# Patient Record
Sex: Female | Born: 1945 | Race: White | Hispanic: No | Marital: Single | Smoking: Current some day smoker
Health system: Southern US, Community
[De-identification: ages and names within clinical notes are randomized; demographics above are authoritative.]

## PROBLEM LIST (undated history)

## (undated) DIAGNOSIS — R0602 Shortness of breath: Secondary | ICD-10-CM

## (undated) DIAGNOSIS — E785 Hyperlipidemia, unspecified: Secondary | ICD-10-CM

## (undated) DIAGNOSIS — Z780 Asymptomatic menopausal state: Secondary | ICD-10-CM

## (undated) DIAGNOSIS — F419 Anxiety disorder, unspecified: Secondary | ICD-10-CM

## (undated) DIAGNOSIS — R110 Nausea: Secondary | ICD-10-CM

## (undated) DIAGNOSIS — I10 Essential (primary) hypertension: Secondary | ICD-10-CM

## (undated) DIAGNOSIS — M549 Dorsalgia, unspecified: Secondary | ICD-10-CM

## (undated) DIAGNOSIS — R079 Chest pain, unspecified: Secondary | ICD-10-CM

## (undated) HISTORY — DX: Shortness of breath: R06.02

## (undated) HISTORY — DX: Hyperlipidemia, unspecified: E78.5

## (undated) HISTORY — PX: CHOLECYSTECTOMY: SHX55

## (undated) HISTORY — DX: Anxiety disorder, unspecified: F41.9

## (undated) HISTORY — DX: Nausea: R11.0

## (undated) HISTORY — DX: Dorsalgia, unspecified: M54.9

## (undated) HISTORY — DX: Asymptomatic menopausal state: Z78.0

## (undated) HISTORY — DX: Chest pain, unspecified: R07.9

## (undated) HISTORY — PX: ANKLE FRACTURE SURGERY: SHX122

---

## 1997-11-23 ENCOUNTER — Other Ambulatory Visit: Admission: RE | Admit: 1997-11-23 | Discharge: 1997-11-23 | Payer: Self-pay | Admitting: Obstetrics and Gynecology

## 1999-10-01 ENCOUNTER — Encounter: Admission: RE | Admit: 1999-10-01 | Discharge: 1999-10-01 | Payer: Self-pay | Admitting: Family Medicine

## 1999-10-01 ENCOUNTER — Encounter: Payer: Self-pay | Admitting: Family Medicine

## 2001-12-23 ENCOUNTER — Ambulatory Visit (HOSPITAL_COMMUNITY): Admission: RE | Admit: 2001-12-23 | Discharge: 2001-12-23 | Payer: Self-pay | Admitting: General Surgery

## 2001-12-23 ENCOUNTER — Encounter (INDEPENDENT_AMBULATORY_CARE_PROVIDER_SITE_OTHER): Payer: Self-pay | Admitting: Specialist

## 2005-01-16 ENCOUNTER — Encounter (INDEPENDENT_AMBULATORY_CARE_PROVIDER_SITE_OTHER): Payer: Self-pay | Admitting: *Deleted

## 2005-01-16 ENCOUNTER — Ambulatory Visit (HOSPITAL_COMMUNITY): Admission: RE | Admit: 2005-01-16 | Discharge: 2005-01-16 | Payer: Self-pay | Admitting: Gastroenterology

## 2010-07-07 ENCOUNTER — Emergency Department (HOSPITAL_BASED_OUTPATIENT_CLINIC_OR_DEPARTMENT_OTHER)
Admission: EM | Admit: 2010-07-07 | Discharge: 2010-07-07 | Payer: Self-pay | Source: Home / Self Care | Admitting: Emergency Medicine

## 2010-07-07 ENCOUNTER — Ambulatory Visit (HOSPITAL_COMMUNITY)
Admission: EM | Admit: 2010-07-07 | Discharge: 2010-07-08 | Payer: Self-pay | Source: Home / Self Care | Attending: Orthopedic Surgery | Admitting: Orthopedic Surgery

## 2010-10-06 LAB — DIFFERENTIAL
Basophils Relative: 0 % (ref 0–1)
Eosinophils Absolute: 0.1 10*3/uL (ref 0.0–0.7)
Lymphs Abs: 1.7 10*3/uL (ref 0.7–4.0)
Monocytes Absolute: 0.5 10*3/uL (ref 0.1–1.0)
Monocytes Relative: 5 % (ref 3–12)
Neutrophils Relative %: 75 % (ref 43–77)

## 2010-10-06 LAB — URINALYSIS, ROUTINE W REFLEX MICROSCOPIC
Glucose, UA: NEGATIVE mg/dL
Hgb urine dipstick: NEGATIVE
Ketones, ur: 15 mg/dL — AB
Protein, ur: NEGATIVE mg/dL
pH: 6 (ref 5.0–8.0)

## 2010-10-06 LAB — COMPREHENSIVE METABOLIC PANEL
AST: 21 U/L (ref 0–37)
Albumin: 3.7 g/dL (ref 3.5–5.2)
Calcium: 9 mg/dL (ref 8.4–10.5)
Chloride: 110 mEq/L (ref 96–112)
Creatinine, Ser: 0.62 mg/dL (ref 0.4–1.2)
Glucose, Bld: 98 mg/dL (ref 70–99)
Total Bilirubin: 0.5 mg/dL (ref 0.3–1.2)
Total Protein: 6.7 g/dL (ref 6.0–8.3)

## 2010-10-06 LAB — CBC
Hemoglobin: 13.6 g/dL (ref 12.0–15.0)
Platelets: 204 10*3/uL (ref 150–400)
RBC: 4.35 MIL/uL (ref 3.87–5.11)
WBC: 9.7 10*3/uL (ref 4.0–10.5)

## 2010-10-06 LAB — URINE MICROSCOPIC-ADD ON

## 2010-12-12 NOTE — Op Note (Signed)
NAMEMYKELL, GENAO              ACCOUNT NO.:  [id number]   MEDICAL RECORD NO.:  [id number]          PATIENT TYPE:  AMB   LOCATION:  ENDO                         FACILITY:  [facility]   PHYSICIAN:  Anselmo Rod, M.D.  DATE OF BIRTH:  1946/06/03   DATE OF PROCEDURE:  01/16/2005  DATE OF DISCHARGE:                                 OPERATIVE REPORT   PROCEDURE:  Colonoscopy with cold biopsies x 2.   ENDOSCOPIST:  Charna Elizabeth, M.D.   INSTRUMENT USED:  Olympus video colonoscope.   INDICATIONS FOR PROCEDURE:  65 year old white female undergoing screening  colonoscopy.  The patient has a history of occasional diarrhea, but denies  any rectal bleeding, change in bowel habits, or family history of colon  cancer.   PREPROCEDURE PREPARATION:  Informed consent was obtained from the patient.  The patient was fasted for eight hours prior to the procedure and prepped  with a bottle of magnesium citrate and a gallon of GoLYTELY the night prior  to the procedure.  The risks and benefits of the procedure including a 10%  missed rate of cancer and polyps was discussed with the patient, as well.   PREPROCEDURE PHYSICAL:  Patient with stable vital signs.  Neck supple.  Chest clear to auscultation.  S1 and S2 regular.  Abdomen soft with normal  bowel sounds.   DESCRIPTION OF PROCEDURE:  The patient was placed in the left lateral  decubitus position, sedated with 75 mg of Demerol and 7.5 mg Versed in slow  incremental doses.  Once the patient was adequately sedated, maintained on  low flow oxygen and continuous cardiac monitoring, the Olympus video  colonoscope was advanced from the rectum to the cecum.  The appendiceal  orifice and ileocecal valve were clearly visualized and photographed.  A  small sessile polyp was biopsied x 2 (cold biopsy) from the rectosigmoid  colon.  Small internal hemorrhoids were seen on retroflexion.  Early sigmoid  diverticulosis noted in the left colon.   IMPRESSION:  1.  Small nonbleeding internal hemorrhoids.  2.  Early sigmoid diverticulosis.  3.  Small sessile polyp biopsied from rectosigmoid colon (cold biopsies x      2).   RECOMMENDATIONS:  1.  Await pathology results.  2.  Avoid all nonsteroidals including aspirin for the next two weeks.  3.  Brochures on diverticulosis were given to the for education.  4.  Repeat colonoscopy depending on pathology results.  5.  Outpatient follow up on a p.r.n. basis in the future.       JNM/MEDQ  D:  01/16/2005  T:  01/16/2005  Job:  [id number]   cc:   Teena Irani. Arlyce Dice, M.D.  [address]  [place]  [place] [address]  Fax: [phone]

## 2010-12-12 NOTE — Op Note (Signed)
[facility]  Patient:    Susan Irwin, Susan Irwin Visit Number: [id number] MRN: [id number]          Service Type: DSU Location: DAY Attending Physician:  Carson Myrtle Dictated by:   Sheppard Plumber Earlene Plater, M.D. Proc. Date: 12/23/01 Admit Date:  12/23/2001   CC:         Teena Irani. Arlyce Dice, M.D.   Operative Report  PREOPERATIVE DIAGNOSES:  Internal and external hemorrhoids.  POSTOPERATIVE DIAGNOSES:  Internal and external hemorrhoids.  PROCEDURE:  Extensive hemorrhoidectomy.  SURGEON:  Timothy E. Earlene Plater, M.D.  ANESTHESIA:  General.  INDICATIONS FOR PROCEDURE:  Susan Irwin is otherwise healthy, has controlled hypertension, claims no problems with her bowel action, has had since pregnancy many hemorrhoids and now because of constant protrusion, soilage, pain and bleeding, she wishes to have a hemorrhoidectomy and this has been carefully discussed with her. She was prepared at home and evaluated by anesthesia.  DESCRIPTION OF PROCEDURE:  She was taken to the operating room, placed supine, general endotracheal anesthesia administered. She was placed in lithotomy, perianal area prepped and draped in the usual fashion. Hemorrhoids were extensive. Right anterior, right posterior, left posterior auxiliary, left anterior auxiliary posterior. The area was injected around and about with marc 0.5% with epinephrine mixed 9:1 with Wydase. This was massaged in well. The anatomy was evaluated and then hemorrhoidal bundles were removed as elliptical incisions in the right anterior, right posterior and left posterior positions. The left posterior encompassed the auxiliary posterior hemorrhoid. Each of the wounds was undermined, the superficial varicosities removed and each wound closed with a running 2-0 chromic suture. The auxiliary anterior hemorrhoid and fibroepithelial polyp was removed and that wound was closed with interrupted chromic suture. The anal orifice was  adequate admitting the operating anoscope. All areas were checked for bleeding. The sphincters were intact. Gelfoam gauze and dry sterile dressing applied. She tolerated it well and was removed to the recovery room in good condition.  Written and verbal instructions including Percocet 5 mg #40 were given and she will be seen and followed as an outpatient. Dictated by:   Sheppard Plumber Earlene Plater, M.D. Attending Physician:  Carson Myrtle DD:  12/23/01 TD:  12/24/01 Job: [id number]

## 2014-02-22 ENCOUNTER — Other Ambulatory Visit: Payer: Self-pay | Admitting: Family Medicine

## 2014-02-22 DIAGNOSIS — R9389 Abnormal findings on diagnostic imaging of other specified body structures: Secondary | ICD-10-CM

## 2014-02-27 ENCOUNTER — Other Ambulatory Visit: Payer: Self-pay

## 2014-02-28 ENCOUNTER — Other Ambulatory Visit: Payer: Self-pay

## 2014-03-01 ENCOUNTER — Ambulatory Visit
Admission: RE | Admit: 2014-03-01 | Discharge: 2014-03-01 | Disposition: A | Discharge status: Home / Self Care | Payer: Medicare HMO | Source: Ambulatory Visit | Attending: Family Medicine | Admitting: Family Medicine

## 2014-03-01 DIAGNOSIS — R9389 Abnormal findings on diagnostic imaging of other specified body structures: Secondary | ICD-10-CM

## 2020-03-26 ENCOUNTER — Emergency Department (HOSPITAL_COMMUNITY)
Admission: EM | Admit: 2020-03-26 | Discharge: 2020-03-26 | Disposition: A | Discharge status: Home / Self Care | Payer: Medicare HMO | Attending: Emergency Medicine | Admitting: Emergency Medicine

## 2020-03-26 ENCOUNTER — Emergency Department (HOSPITAL_COMMUNITY): Payer: Medicare HMO

## 2020-03-26 ENCOUNTER — Other Ambulatory Visit: Payer: Self-pay

## 2020-03-26 ENCOUNTER — Encounter (HOSPITAL_COMMUNITY): Payer: Self-pay

## 2020-03-26 DIAGNOSIS — R202 Paresthesia of skin: Secondary | ICD-10-CM | POA: Insufficient documentation

## 2020-03-26 DIAGNOSIS — I10 Essential (primary) hypertension: Secondary | ICD-10-CM | POA: Insufficient documentation

## 2020-03-26 DIAGNOSIS — F17200 Nicotine dependence, unspecified, uncomplicated: Secondary | ICD-10-CM | POA: Insufficient documentation

## 2020-03-26 DIAGNOSIS — R0789 Other chest pain: Secondary | ICD-10-CM | POA: Diagnosis not present

## 2020-03-26 DIAGNOSIS — R079 Chest pain, unspecified: Secondary | ICD-10-CM | POA: Diagnosis present

## 2020-03-26 HISTORY — DX: Essential (primary) hypertension: I10

## 2020-03-26 LAB — CBC
HCT: 45.4 % (ref 36.0–46.0)
Hemoglobin: 14.7 g/dL (ref 12.0–15.0)
MCH: 30.4 pg (ref 26.0–34.0)
MCHC: 32.4 g/dL (ref 30.0–36.0)
MCV: 94 fL (ref 80.0–100.0)
Platelets: 208 10*3/uL (ref 150–400)
RBC: 4.83 MIL/uL (ref 3.87–5.11)
RDW: 12.7 % (ref 11.5–15.5)
WBC: 8.8 10*3/uL (ref 4.0–10.5)
nRBC: 0 % (ref 0.0–0.2)

## 2020-03-26 LAB — BASIC METABOLIC PANEL
Anion gap: 10 (ref 5–15)
BUN: 9 mg/dL (ref 8–23)
CO2: 26 mmol/L (ref 22–32)
Calcium: 10.4 mg/dL — ABNORMAL HIGH (ref 8.9–10.3)
Chloride: 106 mmol/L (ref 98–111)
Creatinine, Ser: 0.77 mg/dL (ref 0.44–1.00)
GFR calc Af Amer: >60 mL/min (ref >60)
GFR calc non Af Amer: >60 mL/min (ref >60)
Glucose, Bld: 100 mg/dL — ABNORMAL HIGH (ref 70–99)
Potassium: 4.4 mmol/L (ref 3.5–5.1)
Sodium: 142 mmol/L (ref 135–145)

## 2020-03-26 LAB — TROPONIN I (HIGH SENSITIVITY)
Troponin I (High Sensitivity): 2 ng/L (ref <18)
Troponin I (High Sensitivity): <2 ng/L (ref <18)

## 2020-03-26 MED ORDER — METHOCARBAMOL 500 MG PO TABS: 500.0000 mg | ORAL_TABLET | Freq: Three times a day (TID) | ORAL | 0 refills | Status: DC | PRN

## 2020-03-26 NOTE — ED Provider Notes (Signed)
[facility]-EMERGENCY DEPT Provider Note   CSN: [id number] Arrival date & time: 03/26/20  1045     History Chief Complaint  Patient presents with  . Chest Pain  . Nausea  . Numbness    Susan Irwin is a 74 y.o. female.  HPI Patient presents today with right-sided chest pain.  Began last night.  Was rather severe last night but much improved now.  It was in the right side.  Did have some slight numbness in the arm.  Has had chest pain for last couple days but states it is like her typical heartburn.  Pain is not exertional.  Has not really eaten today.  Has been doing well the last couple days.  States she is been able to do her normal activities including mowing the lawn without chest pain.  No fevers.  No chills.  No cough.  No trauma.  No swelling in her legs.  No rash.  Has had shingles in the past.  Patient states that prior to this she is dealt with pain in her right back.  States it seems to be more musculoskeletal pain.  Certain movements make it worse.  Certain movements make the pain worse in the chest today too.    Past Medical History:  Diagnosis Date  . Hypertension     There are no problems to display for this patient.   Past Surgical History:  Procedure Laterality Date  . ANKLE FRACTURE SURGERY Right   . CHOLECYSTECTOMY       OB History   No obstetric history on file.     Family History  Problem Relation Age of Onset  . Hypertension Mother   . Hypertension Father     Social History   Tobacco Use  . Smoking status: Current Some Day Smoker  . Smokeless tobacco: Never Used  Vaping Use  . Vaping Use: Never used  Substance Use Topics  . Alcohol use: Yes    Comment: occasionally  . Drug use: Never    Home Medications Prior to Admission medications   Medication Sig Start Date End Date Taking? Authorizing Provider  methocarbamol (ROBAXIN) 500 MG tablet Take 1 tablet (500 mg total) by mouth every 8 (eight) hours as needed for  muscle spasms. 03/26/20   Benjiman Core, MD    Allergies    Sulfa antibiotics and Sulfur  Review of Systems   Review of Systems  Constitutional: Negative for appetite change.  HENT: Negative for congestion.   Respiratory: Negative for shortness of breath.   Cardiovascular: Positive for chest pain.  Gastrointestinal: Negative for abdominal pain.  Genitourinary: Negative for flank pain.  Musculoskeletal: Negative for back pain.  Skin: Negative for rash.  Neurological: Positive for numbness.  Psychiatric/Behavioral: Negative for confusion.    Physical Exam Updated Vital Signs BP (!) 172/84   Pulse 75   Temp 98.2 F (36.8 C) (Oral)   Resp 16   Ht 5\' 3"  (1.6 m)   Wt 71.7 kg   SpO2 98%   BMI 27.99 kg/m   Physical Exam Vitals and nursing note reviewed.  HENT:     Head: Normocephalic.  Cardiovascular:     Rate and Rhythm: Normal rate and regular rhythm.     Heart sounds: No murmur heard.   Pulmonary:     Breath sounds: Normal breath sounds.  Chest:     Chest wall: Tenderness present.     Comments: Some muscular tenderness along right posterior back.  No  rash. Musculoskeletal:     Cervical back: Neck supple.     Right lower leg: No edema.     Left lower leg: No edema.  Skin:    General: Skin is warm and dry.     Capillary Refill: Capillary refill takes less than 2 seconds.  Neurological:     Mental Status: She is alert and oriented to person, place, and time.     ED Results / Procedures / Treatments   Labs (all labs ordered are listed, but only abnormal results are displayed) Labs Reviewed  BASIC METABOLIC PANEL - Abnormal; Notable for the following components:      Result Value   Glucose, Bld 100 (*)    Calcium 10.4 (*)    All other components within normal limits  CBC  TROPONIN I (HIGH SENSITIVITY)  TROPONIN I (HIGH SENSITIVITY)    EKG EKG Interpretation  Date/Time:  Tuesday March 26 2020 10:56:05 EDT Ventricular Rate:  80 PR Interval:    QRS  Duration: 93 QT Interval:  389 QTC Calculation: 449 R Axis:   46 Text Interpretation: Sinus rhythm Borderline T abnormalities, anterior leads 12 Lead; Mason-Likar Confirmed by Benjiman Core [phone]) on 03/26/2020 8:17:51 PM   Radiology DG Chest 2 View  Result Date: 03/26/2020 CLINICAL DATA:  Pain EXAM: CHEST - 2 VIEW COMPARISON:  02/06/2014 chest radiograph and prior. FINDINGS: The heart size and mediastinal contours are within normal limits. Both lungs are clear. The visualized skeletal structures are unremarkable. Right upper quadrant surgical clips. IMPRESSION: No acute airspace disease. Electronically Signed   By: Stana Bunting M.D.   On: 03/26/2020 11:31    Procedures Procedures (including critical care time)  Medications Ordered in ED Medications - No data to display  ED Course  I have reviewed the triage vital signs and the nursing notes.  Pertinent labs & imaging results that were available during my care of the patient were reviewed by me and considered in my medical decision making (see chart for details).    MDM Rules/Calculators/A&P                          Patient with right-sided chest pain.  EKG reassuring.  X-ray reassuring.  Troponin negative x2.  Overall I think she is low risk for cardiac cause.  PE felt less likely.  Less likely aortic dissection also.  Potentially could be musculoskeletal.  Worse with certain movements and worse with palpation on the back.  Also early zoster considered.  No rash yet but if develops would need further treatment.  Will discharge home with some muscle relaxers.  PCP follow-up as needed. Final Clinical Impression(s) / ED Diagnoses Final diagnoses:  Nonspecific chest pain    Rx / DC Orders ED Discharge Orders         Ordered    methocarbamol (ROBAXIN) 500 MG tablet  Every 8 hours PRN        03/26/20 2039           Benjiman Core, MD 03/26/20 2102

## 2020-03-26 NOTE — ED Triage Notes (Addendum)
Patient c/o having heart burn 2 days ago and last night states she began having mid chest pain that radiates into the mid back and right arm numbness. Patient c/o nausea today.

## 2020-04-10 ENCOUNTER — Telehealth: Payer: Self-pay

## 2020-04-10 NOTE — Telephone Encounter (Signed)
NOTES ON FILE FROM [facility] [phone], SENT REFERRAL TO SCHEDULING °

## 2020-04-16 ENCOUNTER — Encounter: Payer: Self-pay | Admitting: Cardiology

## 2020-04-16 ENCOUNTER — Ambulatory Visit: Payer: Medicare HMO | Admitting: Cardiology

## 2020-04-16 ENCOUNTER — Other Ambulatory Visit: Payer: Self-pay

## 2020-04-16 VITALS — BP 138/82 | HR 86 | Ht 63.0 in | Wt 152.8 lb

## 2020-04-16 DIAGNOSIS — R079 Chest pain, unspecified: Secondary | ICD-10-CM

## 2020-04-16 NOTE — Progress Notes (Signed)
Electrophysiology Office Note:    Date:  04/16/2020   ID:  Susan Irwin, Susan Irwin 1945-10-24, MRN [id number]  PCP:  Richmond Campbell., PA-C  [facility] Cardiologist:  No primary care provider on file.  [facility] Electrophysiologist:  None   Referring MD: Richmond Campbell., PA-C   Chief Complaint: Chest pain  History of Present Illness:    Susan Irwin is a 74 y.o. female who presents for an evaluation of chest pain at the request of Mady Gemma, PA-C. Their medical history includes HTN, HLD, anxiety and GERD. She presented to the [facility] ER 9/1 for chest pain. Workup there includes ECG, CXR, labs and troponins which were all normal. The pain was dull and located in the R chest. It radiated to the mid back.  She does not have her gallbladder. She has some trouble with acid reflux. She is active, works in the yard, gardens. Lost her son in law to a heart attack prior to covid.   Past Medical History:  Diagnosis Date   Anxiety    Back pain    Chest pain    Hyperlipidemia    Hypertension    Nausea    Post-menopausal    SOB (shortness of breath)     Past Surgical History:  Procedure Laterality Date   ANKLE FRACTURE SURGERY Right    CHOLECYSTECTOMY      Current Medications: Current Meds  Medication Sig   ALPRAZolam (XANAX) 0.5 MG tablet Take 1 tablet by mouth as needed for anxiety.   amLODipine (NORVASC) 5 MG tablet Take 1 tablet by mouth daily.   atorvastatin (LIPITOR) 20 MG tablet Take 1 tablet by mouth daily.   calcium-vitamin D (OSCAL WITH D) 500-200 MG-UNIT TABS tablet Take 1 tablet by mouth daily.   cetirizine (ZYRTEC) 10 MG tablet Take 1 tablet by mouth daily.   lisinopril (ZESTRIL) 10 MG tablet Take 1 tablet by mouth daily.   pantoprazole (PROTONIX) 40 MG tablet Take 1 tablet by mouth daily.   sucralfate (CARAFATE) 1 g tablet Take 1 g by mouth daily.     Allergies:   Sulfa antibiotics and Sulfur   Social History    Socioeconomic History   Marital status: Single    Spouse name: Not on file   Number of children: Not on file   Years of education: Not on file   Highest education level: Not on file  Occupational History   Not on file  Tobacco Use   Smoking status: Current Some Day Smoker   Smokeless tobacco: Never Used  Vaping Use   Vaping Use: Never used  Substance and Sexual Activity   Alcohol use: Yes    Comment: occasionally   Drug use: Never   Sexual activity: Not on file  Other Topics Concern   Not on file  Social History Narrative   Not on file   Social Determinants of Health   Financial Resource Strain:    Difficulty of Paying Living Expenses: Not on file  Food Insecurity:    Worried About Running Out of Food in the Last Year: Not on file   Ran Out of Food in the Last Year: Not on file  Transportation Needs:    Lack of Transportation (Medical): Not on file   Lack of Transportation (Non-Medical): Not on file  Physical Activity:    Days of Exercise per Week: Not on file   Minutes of Exercise per Session: Not on file  Stress:  Feeling of Stress : Not on file  Social Connections:    Frequency of Communication with Friends and Family: Not on file   Frequency of Social Gatherings with Friends and Family: Not on file   Attends Religious Services: Not on file   Active Member of Clubs or Organizations: Not on file   Attends Banker Meetings: Not on file   Marital Status: Not on file     Family History: The patient's family history includes Hypertension in her father and mother.  ROS:   Please see the history of present illness.    All other systems reviewed and are negative.  EKGs/Labs/Other Studies Reviewed:    The following studies were reviewed today: OSH records, ECG  8/31 ECG personally reviewed Normal sinus rhythm   EKG:  The ekg ordered today demonstrates normal sinus rhythm   Recent Labs: 03/26/2020: BUN 9;  Creatinine, Ser 0.77; Hemoglobin 14.7; Platelets 208; Potassium 4.4; Sodium 142  Recent Lipid Panel No results found for: CHOL, TRIG, HDL, CHOLHDL, VLDL, LDLCALC, LDLDIRECT  Physical Exam:    VS:  BP 138/82    Pulse 86    Ht 5\' 3"  (1.6 m)    Wt 152 lb 12.8 oz (69.3 kg)    SpO2 94%    BMI 27.07 kg/m     Wt Readings from Last 3 Encounters:  04/16/20 152 lb 12.8 oz (69.3 kg)  03/26/20 158 lb (71.7 kg)     GEN:  Well nourished, well developed in no acute distress HEENT: Normal NECK: No JVD; No carotid bruits LYMPHATICS: No lymphadenopathy CARDIAC: RRR, no murmurs, rubs, gallops RESPIRATORY:  Clear to auscultation without rales, wheezing or rhonchi  ABDOMEN: Soft, non-tender, non-distended MUSCULOSKELETAL:  No edema; No deformity  SKIN: Warm and dry NEUROLOGIC:  Alert and oriented x 3 PSYCHIATRIC:  Normal affect   ASSESSMENT:    1. Chest pain of uncertain etiology   2. Chest pain, unspecified type    PLAN:    In order of problems listed above:  1. Chest pain of uncertain etiology Unclear cause but unlikely to be ischemic. More likely musculoskeletal or GI related. Would like to do an exercise nuclear stress to quantify exercise capacity and confirm no ischemic disease. Echo to rule out structural heart disease. Follow up 3 months or sooner prn.   Medication Adjustments/Labs and Tests Ordered: Current medicines are reviewed at length with the patient today.  Concerns regarding medicines are outlined above.  Orders Placed This Encounter  Procedures   Myocardial Perfusion Imaging   EKG 12-Lead   ECHOCARDIOGRAM COMPLETE   No orders of the defined types were placed in this encounter.    Signed, 03/28/20, MD, [facility]  04/16/2020 2:05 PM    Electrophysiology [facility]

## 2020-04-16 NOTE — Patient Instructions (Addendum)
Medication Instructions:  Your physician recommends that you continue on your current medications as directed. Please refer to the Current Medication list given to you today.  Labwork: None ordered.  Testing/Procedures: Your physician has requested that you have an echocardiogram. Echocardiography is a painless test that uses sound waves to create images of your heart. It provides your doctor with information about the size and shape of your heart and how well your heart's chambers and valves are working. This procedure takes approximately one hour. There are no restrictions for this procedure.  Please schedule for ECHO  Your physician has requested that you have en exercise stress myoview.   Please schedule for exercise myoview.   Follow-Up: Your physician wants you to follow-up in: 3 months with Dr. Lalla Brothers.     Any Other Special Instructions Will Be Listed Below (If Applicable).  If you need a refill on your cardiac medications before your next appointment, please call your pharmacy.

## 2020-05-01 ENCOUNTER — Telehealth (HOSPITAL_COMMUNITY): Payer: Self-pay

## 2020-05-01 NOTE — Telephone Encounter (Signed)
Detailed instructions left on the patient's answering machine. Asked to call back with any questions. S.Talena Neira EMTP 

## 2020-05-03 ENCOUNTER — Other Ambulatory Visit (HOSPITAL_COMMUNITY)
Admission: RE | Admit: 2020-05-03 | Discharge: 2020-05-03 | Disposition: A | Discharge status: Home / Self Care | Payer: Medicare HMO | Source: Ambulatory Visit | Attending: Cardiology | Admitting: Cardiology

## 2020-05-03 DIAGNOSIS — Z01812 Encounter for preprocedural laboratory examination: Secondary | ICD-10-CM | POA: Insufficient documentation

## 2020-05-03 DIAGNOSIS — Z20822 Contact with and (suspected) exposure to covid-19: Secondary | ICD-10-CM | POA: Diagnosis not present

## 2020-05-03 LAB — SARS CORONAVIRUS 2 (TAT 6-24 HRS): SARS Coronavirus 2: NEGATIVE

## 2020-05-07 ENCOUNTER — Encounter (HOSPITAL_COMMUNITY): Payer: Medicare HMO

## 2020-05-07 ENCOUNTER — Other Ambulatory Visit: Payer: Self-pay

## 2020-05-07 ENCOUNTER — Other Ambulatory Visit (HOSPITAL_COMMUNITY): Payer: Medicare HMO

## 2020-05-07 ENCOUNTER — Ambulatory Visit (HOSPITAL_COMMUNITY): Payer: Medicare HMO | Attending: Cardiology

## 2020-05-07 DIAGNOSIS — R079 Chest pain, unspecified: Secondary | ICD-10-CM | POA: Insufficient documentation

## 2020-05-07 LAB — ECHOCARDIOGRAM COMPLETE
Area-P 1/2: 3.31 cm2
S' Lateral: 2.8 cm

## 2020-06-05 ENCOUNTER — Telehealth (HOSPITAL_COMMUNITY): Payer: Self-pay | Admitting: *Deleted

## 2020-06-05 NOTE — Telephone Encounter (Signed)
Left message on voicemail per DPR in reference to upcoming appointment scheduled on 06/12/20 with detailed instructions given per Myocardial Perfusion Study Information Sheet for the test. LM to arrive 15 minutes early, and that it is imperative to arrive on time for appointment to keep from having the test rescheduled. If you need to cancel or reschedule your appointment, please call the office within 24 hours of your appointment. Failure to do so may result in a cancellation of your appointment, and a $50 no show fee. Phone number given for call back for any questions. Wesly Whisenant Jacqueline     

## 2020-06-12 ENCOUNTER — Ambulatory Visit (HOSPITAL_COMMUNITY): Payer: Medicare HMO | Attending: Cardiovascular Disease

## 2020-06-12 ENCOUNTER — Other Ambulatory Visit: Payer: Self-pay

## 2020-06-12 DIAGNOSIS — R079 Chest pain, unspecified: Secondary | ICD-10-CM | POA: Diagnosis present

## 2020-06-12 LAB — MYOCARDIAL PERFUSION IMAGING
LV dias vol: 61 mL (ref 46–106)
LV sys vol: 20 mL
Peak HR: 104 {beats}/min
Rest HR: 71 {beats}/min
SDS: 0
SRS: 0
SSS: 0
TID: 1.02

## 2020-06-12 MED ORDER — REGADENOSON 0.4 MG/5ML IV SOLN
0.4000 mg | Freq: Once | INTRAVENOUS | Status: AC
  Administered 2020-06-12: 0.4 mg via INTRAVENOUS

## 2020-06-12 MED ORDER — TECHNETIUM TC 99M TETROFOSMIN IV KIT
30.6000 | PACK | Freq: Once | INTRAVENOUS | Status: AC | PRN
  Administered 2020-06-12: 30.6 via INTRAVENOUS
  Filled 2020-06-12: qty 31

## 2020-06-12 MED ORDER — TECHNETIUM TC 99M TETROFOSMIN IV KIT
10.6000 | PACK | Freq: Once | INTRAVENOUS | Status: AC | PRN
  Administered 2020-06-12: 10.6 via INTRAVENOUS
  Filled 2020-06-12: qty 11

## 2020-06-26 ENCOUNTER — Telehealth: Payer: Self-pay | Admitting: Cardiology

## 2020-06-26 NOTE — Telephone Encounter (Signed)
[facility] is returning Tina's call. Please advise.

## 2020-07-16 ENCOUNTER — Ambulatory Visit: Payer: Medicare HMO | Admitting: Cardiology

## 2021-10-28 IMAGING — CR DG CHEST 2V
2 series · 2 of 2 positions shown · non-contrast
Comparison: 02/06/2014 chest radiograph and prior.

CLINICAL DATA: Pain

EXAM:
CHEST - 2 VIEW

[w chest pa]
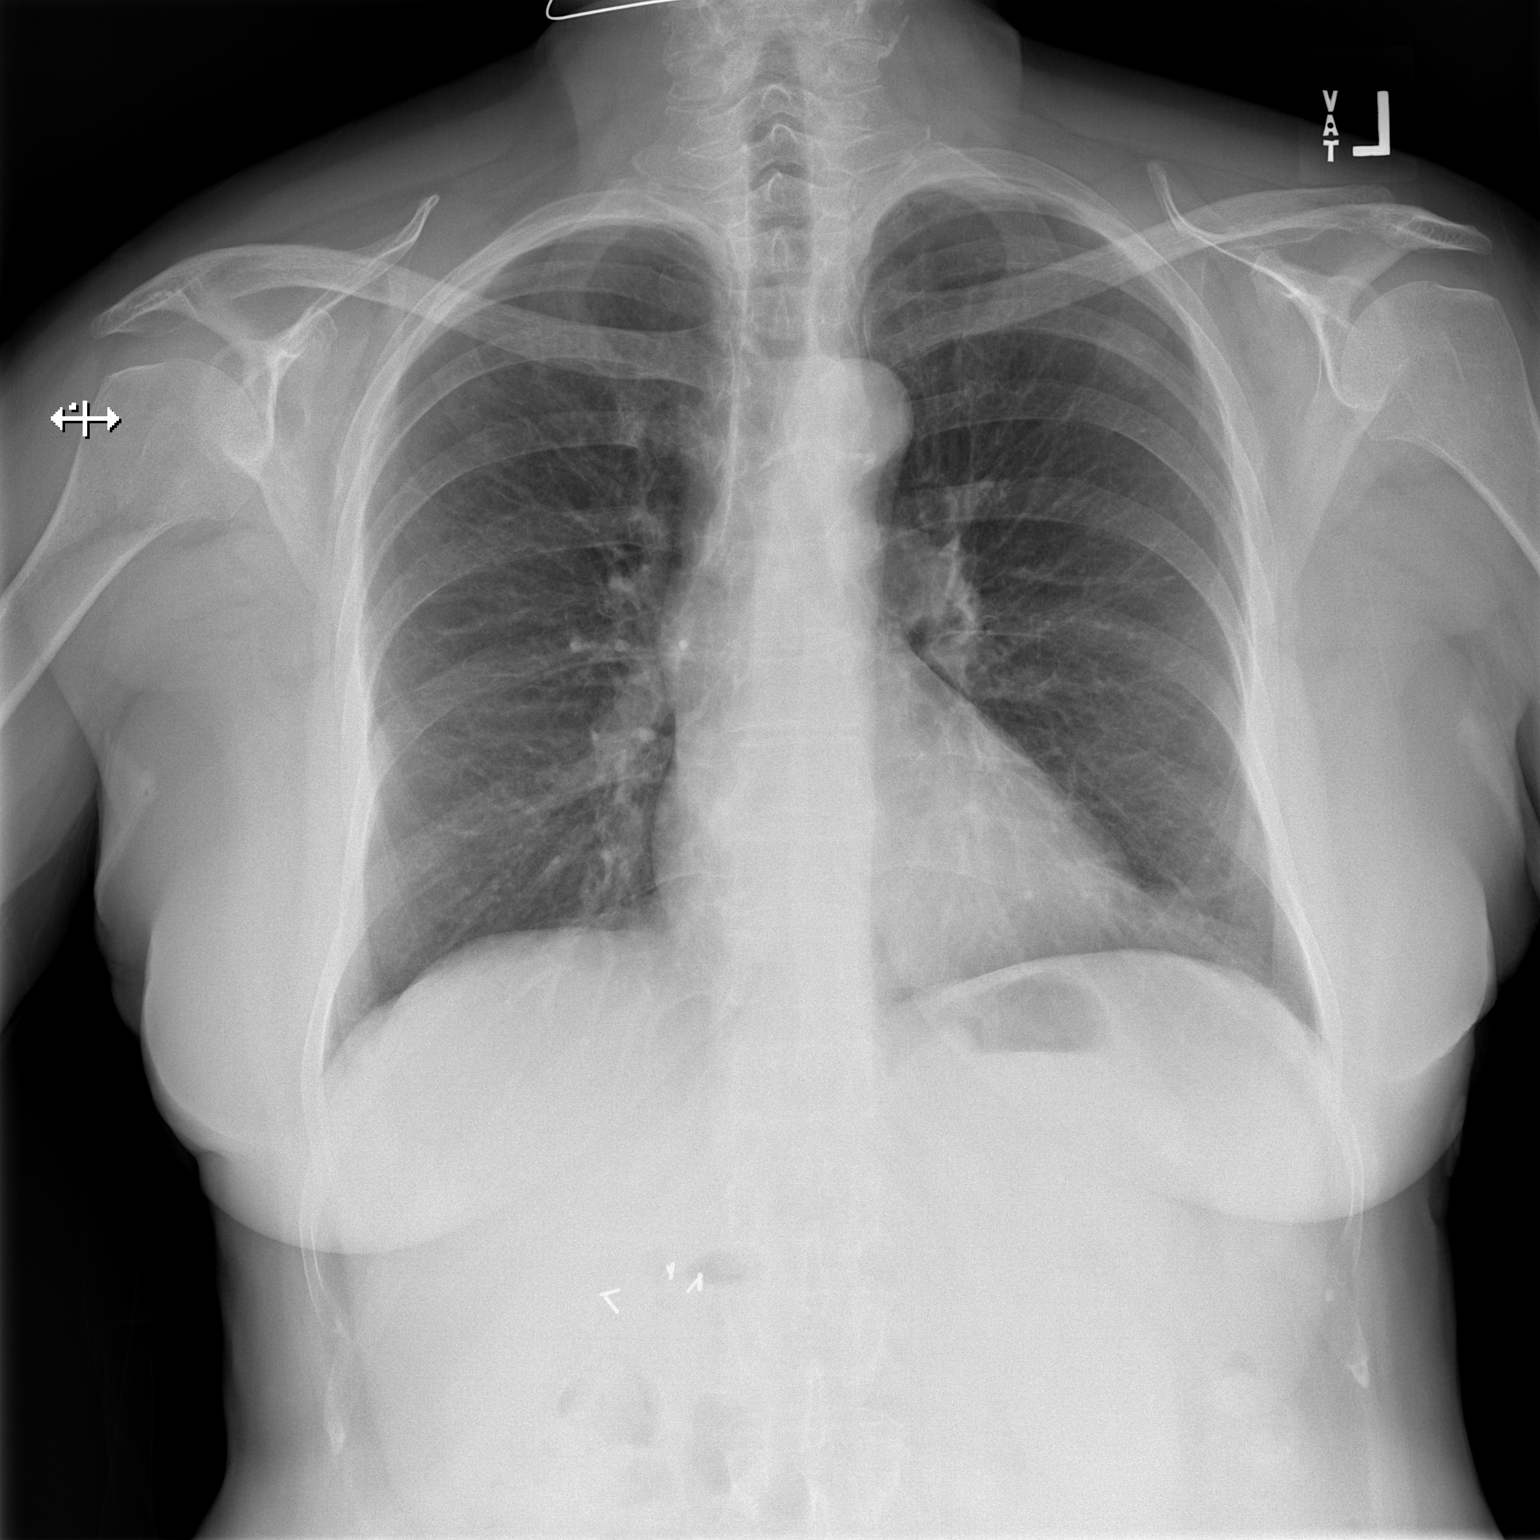

[w chest lat]
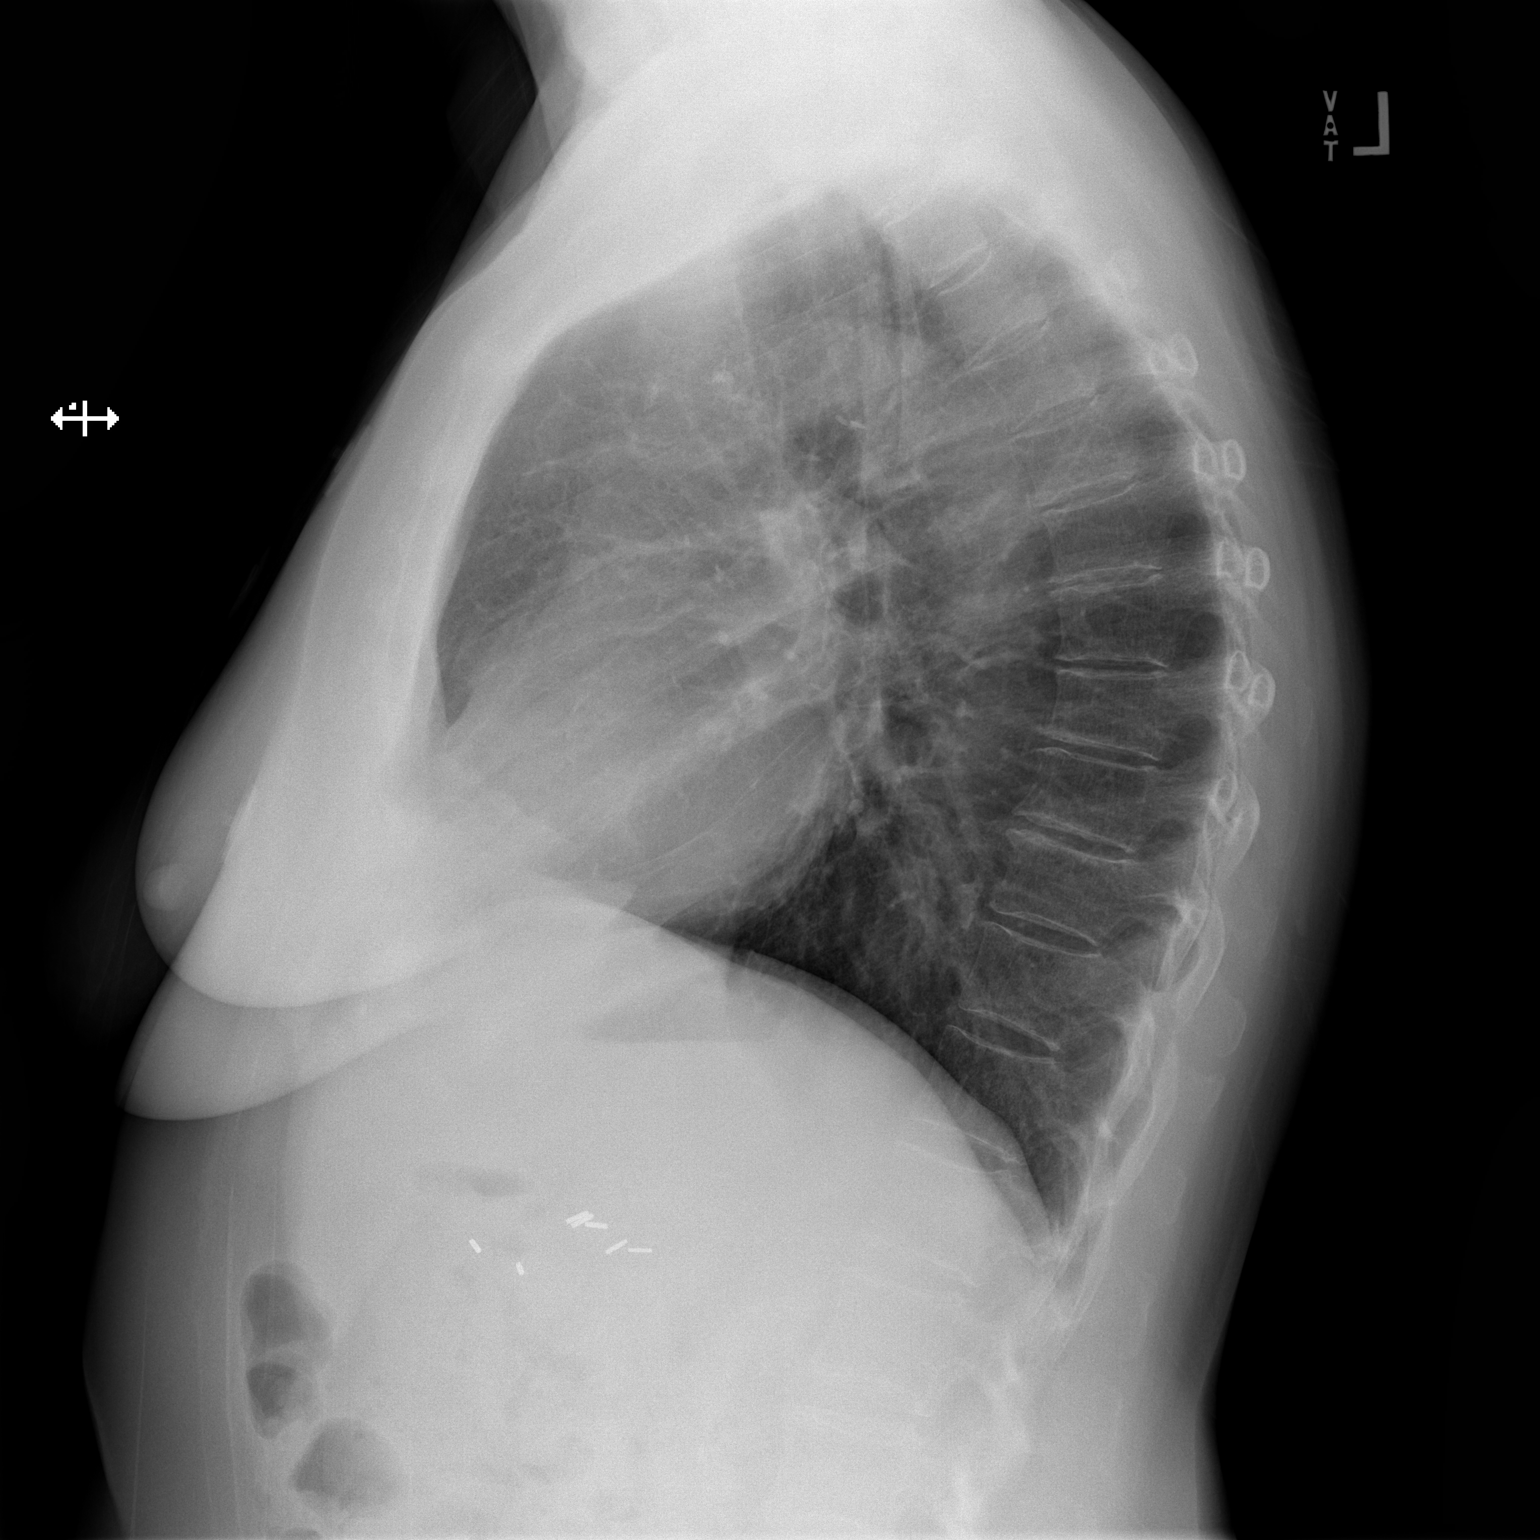

[2 of 2 positions shown; findings below may reference images not displayed]

FINDINGS: The heart size and mediastinal contours are within normal limits.
Both lungs are clear. The visualized skeletal structures are
unremarkable. Right upper quadrant surgical clips.
IMPRESSION: No acute airspace disease.

## 2022-09-04 ENCOUNTER — Emergency Department (HOSPITAL_BASED_OUTPATIENT_CLINIC_OR_DEPARTMENT_OTHER): Payer: Medicare HMO

## 2022-09-04 ENCOUNTER — Other Ambulatory Visit: Payer: Self-pay

## 2022-09-04 ENCOUNTER — Emergency Department (HOSPITAL_BASED_OUTPATIENT_CLINIC_OR_DEPARTMENT_OTHER)
Admission: EM | Admit: 2022-09-04 | Discharge: 2022-09-04 | Disposition: A | Discharge status: Home / Self Care | Payer: Medicare HMO | Attending: Emergency Medicine | Admitting: Emergency Medicine

## 2022-09-04 ENCOUNTER — Encounter (HOSPITAL_BASED_OUTPATIENT_CLINIC_OR_DEPARTMENT_OTHER): Payer: Self-pay | Admitting: Emergency Medicine

## 2022-09-04 DIAGNOSIS — I10 Essential (primary) hypertension: Secondary | ICD-10-CM | POA: Diagnosis not present

## 2022-09-04 DIAGNOSIS — M25511 Pain in right shoulder: Secondary | ICD-10-CM | POA: Diagnosis present

## 2022-09-04 DIAGNOSIS — W19XXXA Unspecified fall, initial encounter: Secondary | ICD-10-CM

## 2022-09-04 DIAGNOSIS — S42294A Other nondisplaced fracture of upper end of right humerus, initial encounter for closed fracture: Secondary | ICD-10-CM | POA: Diagnosis not present

## 2022-09-04 DIAGNOSIS — S52501A Unspecified fracture of the lower end of right radius, initial encounter for closed fracture: Secondary | ICD-10-CM

## 2022-09-04 DIAGNOSIS — W109XXA Fall (on) (from) unspecified stairs and steps, initial encounter: Secondary | ICD-10-CM | POA: Insufficient documentation

## 2022-09-04 MED ORDER — OXYCODONE-ACETAMINOPHEN 5-325 MG PO TABS: 1.0000 | ORAL_TABLET | Freq: Three times a day (TID) | ORAL | 0 refills | Status: AC | PRN

## 2022-09-04 MED ORDER — OXYCODONE-ACETAMINOPHEN 5-325 MG PO TABS
1.0000 | ORAL_TABLET | Freq: Once | ORAL | Status: AC
  Administered 2022-09-04: 1 via ORAL
  Filled 2022-09-04: qty 1

## 2022-09-04 MED ORDER — KETOROLAC TROMETHAMINE 15 MG/ML IJ SOLN
15.0000 mg | Freq: Once | INTRAMUSCULAR | Status: AC
  Administered 2022-09-04: 15 mg via INTRAMUSCULAR
  Filled 2022-09-04: qty 1

## 2022-09-04 NOTE — ED Triage Notes (Signed)
[address] this am going down 3 steps, landed on right arm , pain from upper arm to wrist , no head injury or neck pain . No blood thinners .

## 2022-09-04 NOTE — ED Provider Notes (Signed)
[facility] Provider Note   CSN: [id number] Arrival date & time: 09/04/22  1201     History  Chief Complaint  Patient presents with   Arm Injury    right    Susan Irwin is a 77 y.o. female.   Arm Injury Patient presents after fall.  [place] this morning gone down 3 steps.  Pain in right shoulder and right wrist.  No blood thinners.  Did not hit head.  Pain worse with movement.    Past Medical History:  Diagnosis Date   Anxiety    Back pain    Chest pain    Hyperlipidemia    Hypertension    Nausea    Post-menopausal    SOB (shortness of breath)     Home Medications Prior to Admission medications   Medication Sig Start Date End Date Taking? Authorizing Provider  oxyCODONE-acetaminophen (PERCOCET/ROXICET) 5-325 MG tablet Take 1-2 tablets by mouth every 8 (eight) hours as needed for severe pain. 09/04/22  Yes Davonna Belling, MD  ALPRAZolam Duanne Moron) 0.5 MG tablet Take 1 tablet by mouth as needed for anxiety. 03/16/16   [provider]  amLODipine (NORVASC) 5 MG tablet Take 1 tablet by mouth daily. 05/02/19   [provider]  atorvastatin (LIPITOR) 20 MG tablet Take 1 tablet by mouth daily. 05/02/19   [provider]  calcium-vitamin D (OSCAL WITH D) 500-200 MG-UNIT TABS tablet Take 1 tablet by mouth daily.    [provider]  cetirizine (ZYRTEC) 10 MG tablet Take 1 tablet by mouth daily.    [provider]  lisinopril (ZESTRIL) 10 MG tablet Take 1 tablet by mouth daily. 05/02/19   [provider]  pantoprazole (PROTONIX) 40 MG tablet Take 1 tablet by mouth daily. 05/02/19   [provider]  sucralfate (CARAFATE) 1 g tablet Take 1 g by mouth daily. 04/07/20   [provider]      Allergies    Elemental sulfur and Sulfa antibiotics    Review of Systems   Review of Systems  Physical Exam Updated Vital Signs BP 124/60   Pulse 82   Temp 98.1 F (36.7 C)    Resp 18   Wt 68.9 kg   SpO2 95%   BMI 26.93 kg/m  Physical Exam Vitals reviewed.  HENT:     Head: Atraumatic.  Chest:     Chest wall: No tenderness.  Abdominal:     Tenderness: There is no abdominal tenderness.  Musculoskeletal:        General: Tenderness present.     Cervical back: Neck supple.     Comments: Tenderness over proximal right shoulder.  No clavicular tenderness.  Shoulder appears located but does have some proximal tenderness over humerus.  No elbow tenderness.  However does have tenderness and swelling over the wrist on right side.  Sensation grossly intact in hand.  No other extremity tenderness.  No cervical spine tenderness.  Neurological:     Mental Status: She is alert and oriented to person, place, and time.     ED Results / Procedures / Treatments   Labs (all labs ordered are listed, but only abnormal results are displayed) Labs Reviewed - No data to display  EKG None  Radiology DG Shoulder Right  Result Date: 09/04/2022 CLINICAL DATA:  All going down 3 steps this morning landing on right arm. Pain. EXAM: RIGHT SHOULDER - 2+ VIEW COMPARISON:  None Available. FINDINGS: There is diffuse  decreased bone mineralization. There is an acute fracture of the surgical neck of the right humerus with approximately 7 mm lateral displacement of the distal fracture component with respect to the proximal fracture component and mild impaction. There is up to approximately 9 mm diastases at the lateral aspect of the fracture. The humeral head remains appropriately located with respect to the glenoid fossa. Mild acromioclavicular joint space narrowing and osteophytosis. IMPRESSION: Acute, mildly displaced, and mildly impacted fracture of the surgical neck of the right humerus. Electronically Signed   By: Yvonne Kendall M.D.   On: 09/04/2022 13:27   DG Wrist Complete Right  Result Date: 09/04/2022 CLINICAL DATA:  Fall this morning going down 3 steps. Landed on right arm. EXAM:  RIGHT WRIST - COMPLETE 3+ VIEW COMPARISON:  None Available. FINDINGS: There is diffuse decreased bone mineralization. There is an acute, displaced, and angulated fracture of the distal radial metaphysis with distal intra-articular extension. There is approximately 5 mm dorsal displacement of the distal fracture component with respect to the proximal fracture component on frontal view. 5 mm lateral cortical step-off of the distal fracture component on frontal view. Moderate impaction. There is a thin curvilinear bone density just dorsal to the midcarpal row on lateral view. Mildly to moderately displaced fracture of the ulnar styloid. Moderate high-grade regional soft tissue swelling. IMPRESSION: 1. Acute, displaced, and angulated fracture of the distal radial metaphysis with distal intra-articular extension. 2. Acute mildly to moderately displaced fracture of the ulnar styloid. 3. Curvilinear bone density dorsal to the midcarpal row on lateral view may represent an additional carpal fracture, such as a triquetral fracture. Electronically Signed   By: Yvonne Kendall M.D.   On: 09/04/2022 13:25    Procedures Procedures    Medications Ordered in ED Medications  ketorolac (TORADOL) 15 MG/ML injection 15 mg (15 mg Intramuscular Given 09/04/22 1321)  oxyCODONE-acetaminophen (PERCOCET/ROXICET) 5-325 MG per tablet 1 tablet (1 tablet Oral Given 09/04/22 1321)    ED Course/ Medical Decision Making/ A&P                             Medical Decision Making Amount and/or Complexity of Data Reviewed Radiology: ordered.  Risk Prescription drug management.   Patient with fall.  Right shoulder and right wrist pain.  Unfortunately has broken both.  X-rays independently interpreted by me.  Discussed with Dr. Apolonio Schneiders from hand surgery.        Final Clinical Impression(s) / ED Diagnoses Final diagnoses:  Fall, initial encounter  Closed fracture of distal end of right radius, unspecified fracture morphology,  initial encounter  Other closed nondisplaced fracture of proximal end of right humerus, initial encounter    Rx / DC Orders ED Discharge Orders          Ordered    oxyCODONE-acetaminophen (PERCOCET/ROXICET) 5-325 MG tablet  Every 8 hours PRN        09/04/22 1450              Davonna Belling, MD 09/04/22 1531

## 2022-09-04 NOTE — ED Notes (Signed)
Patient transported to X-ray
